# Patient Record
Sex: Female | Born: 1985 | Race: White | Hispanic: No | Marital: Married | Smoking: Current every day smoker
Health system: Southern US, Community
[De-identification: ages and names within clinical notes are randomized; demographics above are authoritative.]

## PROBLEM LIST (undated history)

## (undated) DIAGNOSIS — E162 Hypoglycemia, unspecified: Secondary | ICD-10-CM

## (undated) DIAGNOSIS — F419 Anxiety disorder, unspecified: Secondary | ICD-10-CM

## (undated) DIAGNOSIS — N920 Excessive and frequent menstruation with regular cycle: Secondary | ICD-10-CM

## (undated) DIAGNOSIS — Z6825 Body mass index (BMI) 25.0-25.9, adult: Secondary | ICD-10-CM

## (undated) DIAGNOSIS — R002 Palpitations: Secondary | ICD-10-CM

## (undated) DIAGNOSIS — J392 Other diseases of pharynx: Secondary | ICD-10-CM

## (undated) DIAGNOSIS — D485 Neoplasm of uncertain behavior of skin: Secondary | ICD-10-CM

## (undated) DIAGNOSIS — R079 Chest pain, unspecified: Secondary | ICD-10-CM

## (undated) DIAGNOSIS — J029 Acute pharyngitis, unspecified: Secondary | ICD-10-CM

## (undated) DIAGNOSIS — J069 Acute upper respiratory infection, unspecified: Secondary | ICD-10-CM

## (undated) DIAGNOSIS — R5383 Other fatigue: Secondary | ICD-10-CM

## (undated) DIAGNOSIS — F633 Trichotillomania: Secondary | ICD-10-CM

## (undated) DIAGNOSIS — I700 Atherosclerosis of aorta: Secondary | ICD-10-CM

## (undated) DIAGNOSIS — R200 Anesthesia of skin: Secondary | ICD-10-CM

## (undated) DIAGNOSIS — N6019 Diffuse cystic mastopathy of unspecified breast: Secondary | ICD-10-CM

## (undated) DIAGNOSIS — R519 Headache, unspecified: Secondary | ICD-10-CM

## (undated) DIAGNOSIS — G4730 Sleep apnea, unspecified: Secondary | ICD-10-CM

## (undated) DIAGNOSIS — Z87891 Personal history of nicotine dependence: Secondary | ICD-10-CM

## (undated) HISTORY — DX: Anesthesia of skin: R20.0

## (undated) HISTORY — DX: Headache, unspecified: R51.9

## (undated) HISTORY — DX: Other diseases of pharynx: J39.2

## (undated) HISTORY — DX: Sleep apnea, unspecified: G47.30

## (undated) HISTORY — DX: Acute upper respiratory infection, unspecified: J06.9

## (undated) HISTORY — DX: Other fatigue: R53.83

## (undated) HISTORY — DX: Diffuse cystic mastopathy of unspecified breast: N60.19

## (undated) HISTORY — DX: Acute pharyngitis, unspecified: J02.9

## (undated) HISTORY — DX: Anxiety disorder, unspecified: F41.9

## (undated) HISTORY — DX: Neoplasm of uncertain behavior of skin: D48.5

## (undated) HISTORY — DX: Personal history of nicotine dependence: Z87.891

## (undated) HISTORY — DX: Palpitations: R00.2

## (undated) HISTORY — DX: Atherosclerosis of aorta: I70.0

## (undated) HISTORY — PX: OTHER SURGICAL HISTORY: SHX169

## (undated) HISTORY — DX: Chest pain, unspecified: R07.9

## (undated) HISTORY — DX: Trichotillomania: F63.3

## (undated) HISTORY — PX: WISDOM TOOTH EXTRACTION: SHX21

## (undated) HISTORY — DX: Hypoglycemia, unspecified: E16.2

## (undated) HISTORY — DX: Excessive and frequent menstruation with regular cycle: N92.0

## (undated) HISTORY — DX: Body mass index (BMI) 25.0-25.9, adult: Z68.25

---

## 2017-01-30 DIAGNOSIS — S37812A Contusion of adrenal gland, initial encounter: Secondary | ICD-10-CM

## 2017-01-30 HISTORY — DX: Contusion of adrenal gland, initial encounter: S37.812A

## 2021-10-13 DIAGNOSIS — R079 Chest pain, unspecified: Secondary | ICD-10-CM | POA: Diagnosis not present

## 2021-10-13 DIAGNOSIS — R202 Paresthesia of skin: Secondary | ICD-10-CM | POA: Diagnosis not present

## 2021-10-13 DIAGNOSIS — R200 Anesthesia of skin: Secondary | ICD-10-CM | POA: Diagnosis not present

## 2021-10-13 DIAGNOSIS — R059 Cough, unspecified: Secondary | ICD-10-CM | POA: Diagnosis not present

## 2021-10-13 DIAGNOSIS — R002 Palpitations: Secondary | ICD-10-CM | POA: Diagnosis not present

## 2021-10-13 DIAGNOSIS — E278 Other specified disorders of adrenal gland: Secondary | ICD-10-CM | POA: Diagnosis not present

## 2021-10-13 DIAGNOSIS — Z1331 Encounter for screening for depression: Secondary | ICD-10-CM | POA: Diagnosis not present

## 2021-10-18 ENCOUNTER — Other Ambulatory Visit: Payer: Self-pay

## 2021-10-18 DIAGNOSIS — E162 Hypoglycemia, unspecified: Secondary | ICD-10-CM | POA: Insufficient documentation

## 2021-10-18 DIAGNOSIS — R079 Chest pain, unspecified: Secondary | ICD-10-CM | POA: Insufficient documentation

## 2021-10-18 DIAGNOSIS — G4730 Sleep apnea, unspecified: Secondary | ICD-10-CM | POA: Insufficient documentation

## 2021-10-18 DIAGNOSIS — R5383 Other fatigue: Secondary | ICD-10-CM | POA: Insufficient documentation

## 2021-10-18 DIAGNOSIS — R200 Anesthesia of skin: Secondary | ICD-10-CM | POA: Insufficient documentation

## 2021-10-18 DIAGNOSIS — N6019 Diffuse cystic mastopathy of unspecified breast: Secondary | ICD-10-CM | POA: Insufficient documentation

## 2021-10-18 DIAGNOSIS — R519 Headache, unspecified: Secondary | ICD-10-CM | POA: Insufficient documentation

## 2021-10-18 DIAGNOSIS — I700 Atherosclerosis of aorta: Secondary | ICD-10-CM | POA: Insufficient documentation

## 2021-10-18 DIAGNOSIS — J069 Acute upper respiratory infection, unspecified: Secondary | ICD-10-CM | POA: Insufficient documentation

## 2021-10-18 DIAGNOSIS — J392 Other diseases of pharynx: Secondary | ICD-10-CM | POA: Insufficient documentation

## 2021-10-18 DIAGNOSIS — D485 Neoplasm of uncertain behavior of skin: Secondary | ICD-10-CM | POA: Insufficient documentation

## 2021-10-18 DIAGNOSIS — Z6825 Body mass index (BMI) 25.0-25.9, adult: Secondary | ICD-10-CM | POA: Insufficient documentation

## 2021-10-18 DIAGNOSIS — J029 Acute pharyngitis, unspecified: Secondary | ICD-10-CM | POA: Insufficient documentation

## 2021-10-18 DIAGNOSIS — R002 Palpitations: Secondary | ICD-10-CM | POA: Insufficient documentation

## 2021-10-18 DIAGNOSIS — N920 Excessive and frequent menstruation with regular cycle: Secondary | ICD-10-CM | POA: Insufficient documentation

## 2021-10-18 DIAGNOSIS — Z87891 Personal history of nicotine dependence: Secondary | ICD-10-CM | POA: Insufficient documentation

## 2021-10-18 DIAGNOSIS — F633 Trichotillomania: Secondary | ICD-10-CM | POA: Insufficient documentation

## 2021-10-18 DIAGNOSIS — F419 Anxiety disorder, unspecified: Secondary | ICD-10-CM | POA: Insufficient documentation

## 2021-10-18 DIAGNOSIS — R202 Paresthesia of skin: Secondary | ICD-10-CM | POA: Insufficient documentation

## 2021-10-19 ENCOUNTER — Other Ambulatory Visit: Payer: Self-pay

## 2021-10-19 ENCOUNTER — Ambulatory Visit: Payer: BC Managed Care – PPO | Admitting: Cardiology

## 2021-10-19 ENCOUNTER — Ambulatory Visit (INDEPENDENT_AMBULATORY_CARE_PROVIDER_SITE_OTHER): Payer: BC Managed Care – PPO

## 2021-10-19 ENCOUNTER — Encounter: Payer: Self-pay | Admitting: Cardiology

## 2021-10-19 VITALS — BP 118/60 | HR 72 | Ht 68.6 in | Wt 156.8 lb

## 2021-10-19 DIAGNOSIS — R072 Precordial pain: Secondary | ICD-10-CM | POA: Diagnosis not present

## 2021-10-19 DIAGNOSIS — I700 Atherosclerosis of aorta: Secondary | ICD-10-CM

## 2021-10-19 DIAGNOSIS — F17210 Nicotine dependence, cigarettes, uncomplicated: Secondary | ICD-10-CM

## 2021-10-19 DIAGNOSIS — R002 Palpitations: Secondary | ICD-10-CM

## 2021-10-19 HISTORY — DX: Nicotine dependence, cigarettes, uncomplicated: F17.210

## 2021-10-19 NOTE — Progress Notes (Signed)
Cardiology Office Note:    Date:  10/19/2021   ID:  Brittany Melendez, DOB 02-20-1986, MRN [id number]  PCP:  Brittany Reel, PA  Cardiologist:  Brittany Lindau, MD   Referring MD: Brittany Reel, PA    ASSESSMENT:    1. Palpitations   2. Atherosclerosis of aorta (Micro)   3. Precordial pain   4. Cigarette smoker    PLAN:    In order of problems listed above:  Primary prevention stressed with the patient.  Importance of compliance with diet medication stressed and she vocalized understanding. Cigarette smoker: I spent 5 minutes with the patient discussing solely about smoking. Smoking cessation was counseled. I suggested to the patient also different medications and pharmacological interventions. Patient is keen to try stopping on its own at this time. He will get back to me if he needs any further assistance in this matter. Chest pain: Atypical in nature but in view of risk factors we will do an exercise stress echo and she is agreeable. Mixed dyslipidemia: Significantly elevated lipids especially LDL and diet was emphasized.  We will do a calcium scoring for restratification and address this issue.  She also has aortic atherosclerosis as mentioned below but I do not have clear-cut documentation of this.  Calcium scoring will help target lipid-lowering. Palpitations: Do not seem to be concerning based on the history but to reassure her we will do a 2-week monitor and she is agreeable. Patient will be seen in follow-up appointment in 6 months or earlier if the patient has any concerns    Medication Adjustments/Labs and Tests Ordered: Current medicines are reviewed at length with the patient today.  Concerns regarding medicines are outlined above.  Orders Placed This Encounter  Procedures   CT CARDIAC SCORING   LONG TERM MONITOR (3-14 DAYS)   EKG 12-Lead   ECHOCARDIOGRAM STRESS TEST   No orders of the defined types were placed in this encounter.    History of Present Illness:     Brittany Melendez is a 36 y.o. female who is being seen today for the evaluation of chest pain and palpitations at the request of Brittany Melendez, [place].  Patient is a pleasant 36 year old female.  She has past medical history of aortic atherosclerosis, mixed dyslipidemia and cigarette smoking.  She mentions to me that she will occasionally have chest discomfort.  This does not get worse with exertion.  It does not even occur on exertion consistently.  No chest pain orthopnea or PND otherwise.  She does not exercise on a regular basis.  Sexual activity does not bring around the symptoms.  She feels palpitation like symptoms very occasionally and its only for a couple of seconds.  She is concerned about this.  At the time of my evaluation, the patient is alert awake oriented and in no distress.  Past Medical History:  Diagnosis Date   Anxiety    Atherosclerosis of aorta (HCC)    BMI 25.0-25.9,adult    Chest pain    Fatigue    Fibrocystic breast changes    Headache    Heavy menses    History of tobacco use    Hypoglycemia    Neoplasm of uncertain behavior of skin    Numbness and tingling    Palpitations    Sleep apnea    Throat mass    Trichotillomania    URI, acute    Viral pharyngitis     Past Surgical History:  Procedure Laterality  Date   adnoidectomy     WISDOM TOOTH EXTRACTION      Current Medications: Current Meds  Medication Sig   Multiple Vitamin (MULTI-VITAMIN) tablet Take 1 tablet by mouth daily.     Allergies:   Shellfish allergy, Doxycycline, and Latex   Social History   Socioeconomic History   Marital status: Married    Spouse name: Not on file   Number of children: Not on file   Years of education: Not on file   Highest education level: Not on file  Occupational History   Not on file  Tobacco Use   Smoking status: Every Day    Packs/day: 0.50    Types: Cigarettes   Smokeless tobacco: Not on file  Substance and Sexual Activity   Alcohol use: Never   Drug  use: Not on file   Sexual activity: Not on file  Other Topics Concern   Not on file  Social History Narrative   Not on file   Social Determinants of Health   Financial Resource Strain: Not on file  Food Insecurity: Not on file  Transportation Needs: Not on file  Physical Activity: Not on file  Stress: Not on file  Social Connections: Not on file     Family History: The patient's family history includes Asthma in her mother; GER disease in her father; Lymphoma in her father; Sarcoidosis in her mother.  ROS:   Please see the history of present illness.    All other systems reviewed and are negative.  EKGs/Labs/Other Studies Reviewed:    The following studies were reviewed today: EKG reveals sinus rhythm and nonspecific ST-T changes   Recent Labs: No results found for requested labs within last 8760 hours.  Recent Lipid Panel No results found for: CHOL, TRIG, HDL, CHOLHDL, VLDL, LDLCALC, LDLDIRECT  Physical Exam:    VS:  BP 118/60    Pulse 72    Ht 5' 8.6" (1.742 m)    Wt 156 lb 12.8 oz (71.1 kg)    SpO2 98%    BMI 23.43 kg/m     Wt Readings from Last 3 Encounters:  10/19/21 156 lb 12.8 oz (71.1 kg)     GEN: Patient is in no acute distress HEENT: Normal NECK: No JVD; No carotid bruits LYMPHATICS: No lymphadenopathy CARDIAC: S1 S2 regular, 2/6 systolic murmur at the apex. RESPIRATORY:  Clear to auscultation without rales, wheezing or rhonchi  ABDOMEN: Soft, non-tender, non-distended MUSCULOSKELETAL:  No edema; No deformity  SKIN: Warm and dry NEUROLOGIC:  Alert and oriented x 3 PSYCHIATRIC:  Normal affect    Signed, Brittany Lindau, MD  10/19/2021 2:28 PM    [place]

## 2021-10-19 NOTE — Patient Instructions (Signed)
Medication Instructions:  Your physician recommends that you continue on your current medications as directed. Please refer to the Current Medication list given to you today.  *If you need a refill on your cardiac medications before your next appointment, please call your pharmacy*   Lab Work: None ordered If you have labs (blood work) drawn today and your tests are completely normal, you will receive your results only by: [place] (if you have MyChart) OR A paper copy in the mail If you have any lab test that is abnormal or we need to change your treatment, we will call you to review the results.   Testing/Procedures:  We will order CT coronary calcium score. It will cost $99.00 and is not covered by insurance.  Please call [phone] to schedule.   [facility]  [address], [place] [address]      Stress Echocardiogram Information Sheet                                                      Instructions:    1. You may take your morning medications the morning of the test  2. Light breakfast no caffeine  3. Dress prepared to exercise.  4. DO NOT use ANY caffeine or tobacco products 3 hours before appointment.  5. Please bring all current prescription medications.   WHY IS MY DOCTOR PRESCRIBING ZIO? The Zio system is proven and trusted by physicians to detect and diagnose irregular heart rhythms -- and has been prescribed to hundreds of thousands of patients.  The FDA has cleared the Zio system to monitor for many different kinds of irregular heart rhythms. In a study, physicians were able to reach a diagnosis 90% of the time with the Zio system1.  You can wear the Zio monitor -- a small, discreet, comfortable patch -- during your normal day-to-day activity, including while you sleep, shower, and exercise, while it records every single heartbeat for analysis.  1Barrett, P., et al. Comparison of 24 Hour Holter Monitoring Versus 14 Day Novel  Adhesive Patch Electrocardiographic Monitoring. [place], 2014.  ZIO VS. HOLTER MONITORING The Zio monitor can be comfortably worn for up to 14 days. Holter monitors can be worn for 24 to 48 hours, limiting the time to record any irregular heart rhythms you may have. Zio is able to capture data for the 51% of patients who have their first symptom-triggered arrhythmia after 48 hours.1  LIVE WITHOUT RESTRICTIONS The Zio ambulatory cardiac monitor is a small, unobtrusive, and water-resistant patch--you might even forget youre wearing it. The Zio monitor records and stores every beat of your heart, whether you're sleeping, working out, or showering. Wear the monitor for 14 days, remove 11/02/21.  Follow-Up: At [facility], you and your health needs are our priority.  As part of our continuing mission to provide you with exceptional heart care, we have created designated Provider Care Teams.  These Care Teams include your primary Cardiologist (physician) and Advanced Practice Providers (APPs -  Physician Assistants and Nurse Practitioners) who all work together to provide you with the care you need, when you need it.  We recommend signing up for the patient portal called "MyChart".  Sign up information is provided on this After Visit Summary.  MyChart is used to connect with  patients for Virtual Visits (Telemedicine).  Patients are able to view lab/test results, encounter notes, upcoming appointments, etc.  Non-urgent messages can be sent to your provider as well.   To learn more about what you can do with MyChart, go to [email or web address].    Your next appointment:   3 month(s)  The format for your next appointment:   In Person  Provider:   Jyl Heinz, MD   Other Instructions  Coronary Calcium Scan A coronary calcium scan is an imaging test used to look for deposits of plaque in the inner lining of the blood vessels of the heart (coronary arteries). Plaque is  made up of calcium, protein, and fatty substances. These deposits of plaque can partly clog and narrow the coronary arteries without producing any symptoms or warning signs. This puts a person at risk for a heart attack. This test is recommended for people who are at moderate risk for heart disease. The test can find plaque deposits before symptoms develop. Tell a health care provider about: Any allergies you have. All medicines you are taking, including vitamins, herbs, eye drops, creams, and over-the-counter medicines. Any problems you or family members have had with anesthetic medicines. Any blood disorders you have. Any surgeries you have had. Any medical conditions you have. Whether you are pregnant or may be pregnant. What are the risks? Generally, this is a safe procedure. However, problems may occur, including: Harm to a pregnant woman and her unborn baby. This test involves the use of radiation. Radiation exposure can be dangerous to a pregnant woman and her unborn baby. If you are pregnant or think you may be pregnant, you should not have this procedure done. Slight increase in the risk of cancer. This is because of the radiation involved in the test. What happens before the procedure? Ask your health care provider for any specific instructions on how to prepare for this procedure. You may be asked to avoid products that contain caffeine, tobacco, or nicotine for 4 hours before the procedure. What happens during the procedure? You will undress and remove any jewelry from your neck or chest. You will put on a hospital gown. Sticky electrodes will be placed on your chest. The electrodes will be connected to an electrocardiogram (ECG) machine to record a tracing of the electrical activity of your heart. You will lie down on a curved bed that is attached to the [place]. You may be given medicine to slow down your heart rate so that clear pictures can be created. You will be moved into  the CT scanner, and the CT scanner will take pictures of your heart. During this time, you will be asked to lie still and hold your breath for 2-3 seconds at a time while each picture of your heart is being taken. The procedure may vary among health care providers and hospitals.    What happens after the procedure? You can get dressed. You can return to your normal activities. It is up to you to get the results of your procedure. Ask your health care provider, or the department that is doing the procedure, when your results will be ready. Summary A coronary calcium scan is an imaging test used to look for deposits of plaque in the inner lining of the blood vessels of the heart (coronary arteries). Plaque is made up of calcium, protein, and fatty substances. Generally, this is a safe procedure. Tell your health care provider if you are pregnant or may be pregnant. Ask  your health care provider for any specific instructions on how to prepare for this procedure. A CT scanner will take pictures of your heart. You can return to your normal activities after the scan is done. This information is not intended to replace advice given to you by your health care provider. Make sure you discuss any questions you have with your health care provider. Document Revised: 04/09/2019 Document Reviewed: 04/09/2019 Elsevier Patient Education  [place].   Exercise Stress Echocardiogram An exercise stress echocardiogram is a test to check how well your heart is working. This test uses sound waves and a computer to make pictures of your heart. These pictures will be taken before and after you exercise. For this test, you will walk on a treadmill or ride a bicycle to make your heart beat faster. While you exercise, your heart will be checked with an electrocardiogram (ECG). Your blood pressure will also be checked. You may have this test if: You have chest pain or a heart problem. You had a heart attack or  heart surgery not long ago. You have heart valve problems. You have a condition that causes narrowing of the blood vessels that supply your heart. You have a high risk of heart disease and: You are starting a new exercise program. You need to have a big surgery. Tell a doctor about: Any allergies you have. All medicines you are taking. This includes vitamins, herbs, eye drops, creams, and over-the-counter medicines. Any problems you or family members have had with medicines that make you fall asleep (anesthetic medicines). Any surgeries you have had. Any blood disorders you have. Any medical conditions you have. Whether you are pregnant or may be pregnant. What are the risks? Generally, this is a safe test. However, problems may occur, including: Chest pain. Feeling dizzy or light-headed. Shortness of breath. Increased or irregular heartbeat. Feeling like you may vomit (nausea) or vomiting. Heart attack. This is very rare. What happens before the test? Medicines Ask your doctor about changing or stopping your normal medicines. This is important if you take diabetes medicines or blood thinners. If you use an inhaler, bring it to the test. General instructions Wear comfortable clothes and walking shoes. Follow instructions from your doctor about what you cannot eat or drink before the test. Do not drink or eat anything that has caffeine in it. Stop having caffeine 24 hours before the test. Do not smoke or use products that contain nicotine or tobacco for 4 hours before the test. If you need help quitting, ask your doctor. What happens during the test?  You will take off your clothes from the waist up and put on a hospital gown. Electrodes or patches will be put on your chest. A blood pressure cuff will be put on your arm. Before you exercise, a computer will make a picture of your heart. To do this: You will lie down and a gel will be put on your chest. A wand will be moved over  the gel. Sound waves from the wand will go to the computer to make the picture. Then, you will start to exercise. You may walk on a treadmill or pedal a bicycle. Your blood pressure and heart rhythm will be checked while you exercise. The exercise will get harder or faster. You will exercise until: Your heart reaches a certain level. You are too tired to go on. You cannot go on because of chest pain, weakness, or dizziness. You will lie down right away so  another picture of your heart can be taken. The procedure may vary among doctors and hospitals. What can I expect after the test? After your test, it is common to have: Mild soreness. Mild tiredness. Your heart rate and blood pressure will be checked until they return to your normal levels. You should not have any new symptoms after this test. Follow these instructions at home: If your doctor says that you can, you may: Eat what you normally eat. Do your normal activities. Take over-the-counter and prescription medicines only as told by your doctor. Keep all follow-up visits. It is up to you to get the results of your test. Ask how to get your results when they are ready. Contact a doctor if: You feel dizzy or light-headed. You have a fast or irregular heartbeat. You feel like you may vomit or you vomit. You have a headache. You feel short of breath. Get help right away if: You develop pain or pressure: In your chest. In your jaw or neck. Between your shoulders. That goes down your left arm. You faint. You have trouble breathing. These symptoms may be an emergency. Get medical help right away. Call your local emergency services (911 in the [place] Do not wait to see if the symptoms will go away. Do not drive yourself to the hospital. Summary This is a test that checks how well your heart is working. Follow instructions about what you cannot eat or drink before the test. Ask your doctor if you should take your normal  medicines before the test. Stop having caffeine 24 hours before the test. Do not smoke or use products with nicotine or tobacco in them for 4 hours before the test. During the test, your blood pressure and heart rhythm will be checked while you exercise. This information is not intended to replace advice given to you by your health care provider. Make sure you discuss any questions you have with your health care provider. Document Revised: 06/02/2021 Document Reviewed: 05/12/2020 Elsevier Patient Education  2022 [employer].

## 2021-11-01 ENCOUNTER — Encounter (HOSPITAL_COMMUNITY): Payer: Self-pay | Admitting: *Deleted

## 2021-11-05 DIAGNOSIS — J342 Deviated nasal septum: Secondary | ICD-10-CM | POA: Diagnosis not present

## 2021-11-05 DIAGNOSIS — J358 Other chronic diseases of tonsils and adenoids: Secondary | ICD-10-CM | POA: Diagnosis not present

## 2021-11-05 DIAGNOSIS — J392 Other diseases of pharynx: Secondary | ICD-10-CM | POA: Diagnosis not present

## 2021-11-08 ENCOUNTER — Ambulatory Visit (HOSPITAL_COMMUNITY): Payer: BC Managed Care – PPO

## 2021-11-08 ENCOUNTER — Other Ambulatory Visit: Payer: Self-pay

## 2021-11-08 ENCOUNTER — Ambulatory Visit (HOSPITAL_COMMUNITY): Payer: BC Managed Care – PPO | Attending: Cardiology

## 2021-11-08 DIAGNOSIS — R072 Precordial pain: Secondary | ICD-10-CM | POA: Insufficient documentation

## 2021-11-08 DIAGNOSIS — I700 Atherosclerosis of aorta: Secondary | ICD-10-CM | POA: Diagnosis not present

## 2021-11-08 MED ORDER — PERFLUTREN LIPID MICROSPHERE
2.0000 mL | INTRAVENOUS | Status: AC | PRN
  Administered 2021-11-08: 2 mL via INTRAVENOUS

## 2022-01-06 ENCOUNTER — Ambulatory Visit (INDEPENDENT_AMBULATORY_CARE_PROVIDER_SITE_OTHER)
Admission: RE | Admit: 2022-01-06 | Discharge: 2022-01-06 | Disposition: A | Discharge status: Home / Self Care | Payer: Self-pay | Source: Ambulatory Visit | Attending: Cardiology | Admitting: Cardiology

## 2022-01-06 ENCOUNTER — Encounter: Payer: Self-pay | Admitting: Radiology

## 2022-01-06 DIAGNOSIS — I700 Atherosclerosis of aorta: Secondary | ICD-10-CM

## 2022-01-18 ENCOUNTER — Ambulatory Visit: Payer: BC Managed Care – PPO | Admitting: Cardiology

## 2023-01-20 IMAGING — CT CT CARDIAC CORONARY ARTERY CALCIUM SCORE
3 series · 14 of 20 positions shown, 16 images · non-contrast
Comparison: CT of the chest on 01/29/2017 at [facility]
COMPARISON: CT of the chest on 01/29/2017 at [facility]

Addendum:
EXAM:
OVER-READ INTERPRETATION  CT CHEST

The following report is an over-read performed by radiologist Dr.
over-read does not include interpretation of cardiac or coronary
anatomy or pathology. The coronary calcium score interpretation by
the cardiologist is attached.
CLINICAL DATA: Cardiovascular Disease Risk stratification
Coronary Calcium Score
TECHNIQUE: A gated, non-contrast computed tomography scan of the heart was
performed using 3mm slice thickness. Axial images were analyzed on a
dedicated workstation. Calcium scoring of the coronary arteries was
performed using the Agatston method.

[Series 2: cascseq 2.0 sa36 70% (id) · axial · 0.39mm/px · z∈[-263,-173]mm · 4 of 76 slices shown]
[im 16/76  vessel]
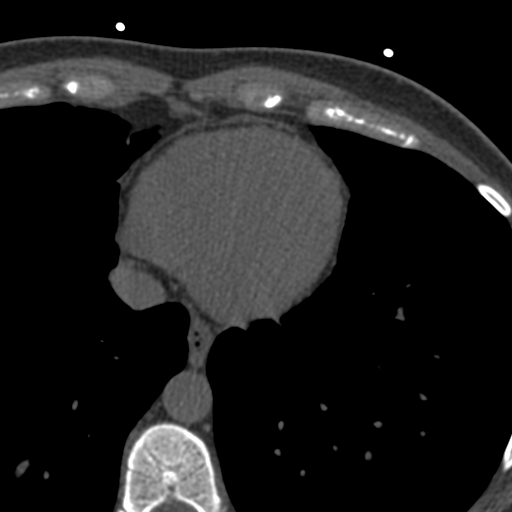
[im 31/76  vessel]
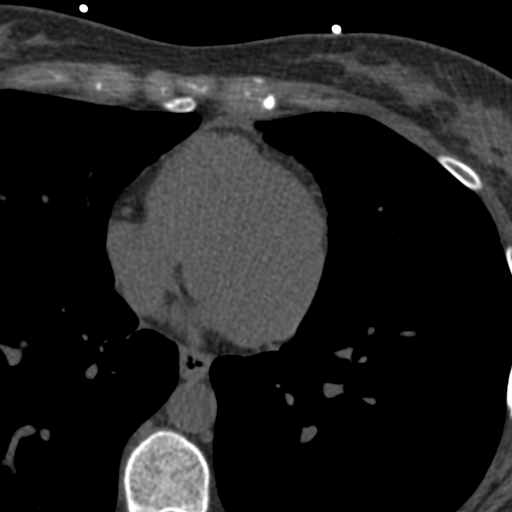
[im 46/76  vessel]
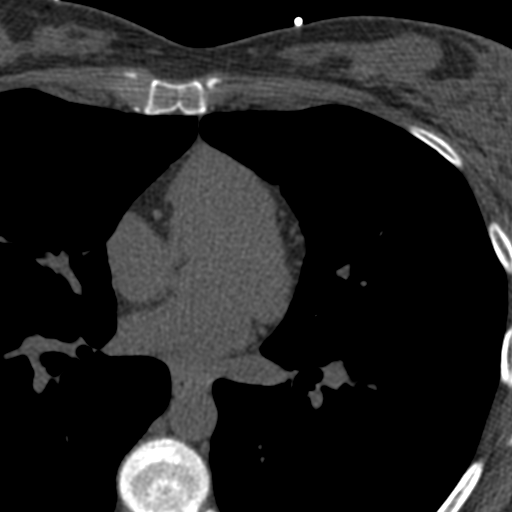
[im 61/76  vessel]
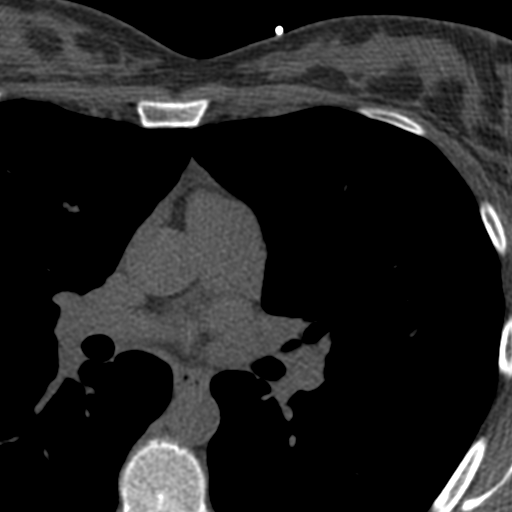

[Series 3: cascseq 2.0 bf37 st · axial · 0.70mm/px · z∈[-269,-169]mm · 5 of 76 slices shown, 7 images]
[im 13/76  vessel]
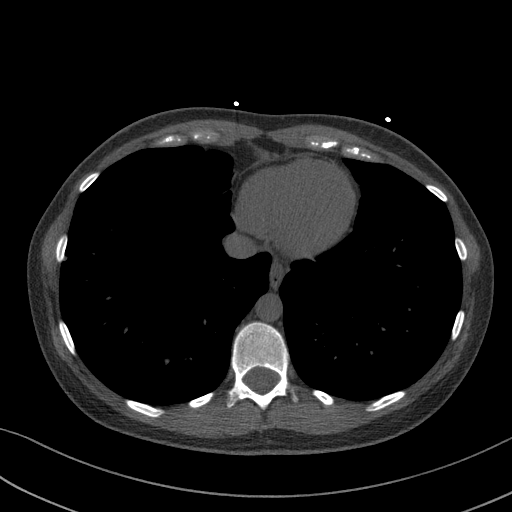
[im 13/76  lung]
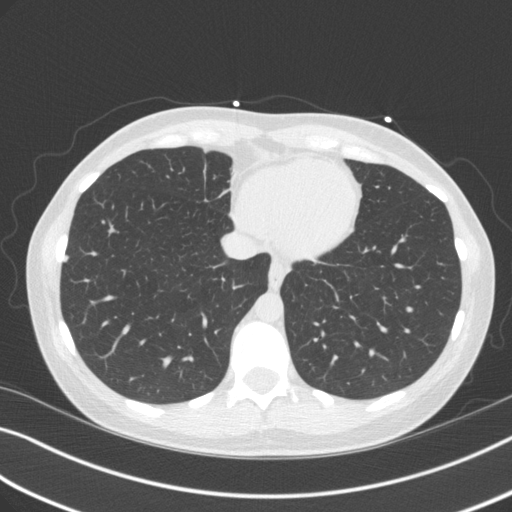
[im 26/76  vessel]
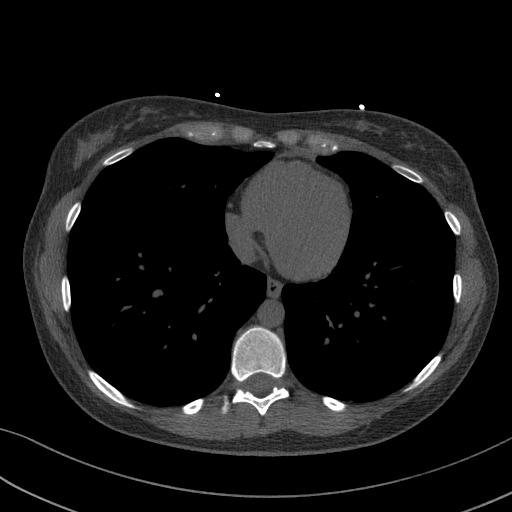
[im 38/76  vessel]
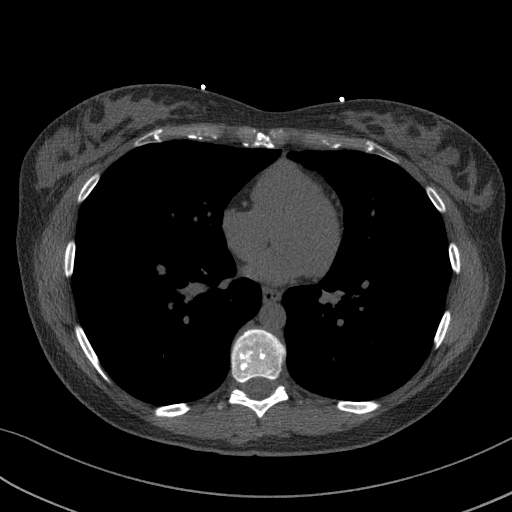
[im 51/76  vessel]
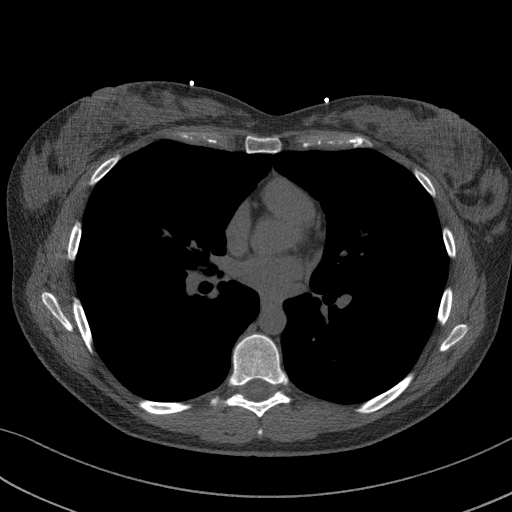
[im 63/76  vessel]
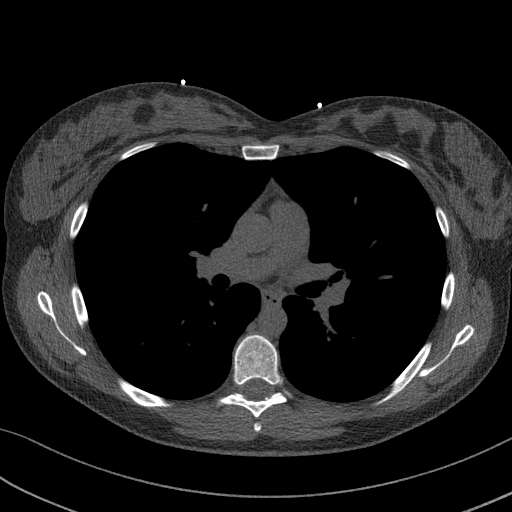
[im 63/76  lung]
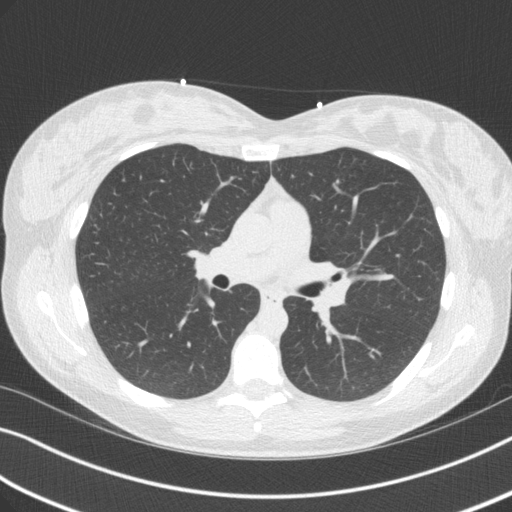

[Series 4: cascseq 2.0 br59 lung · axial · 0.70mm/px · z∈[-269,-169]mm · 5 of 76 slices shown]
[im 13/76  lung]
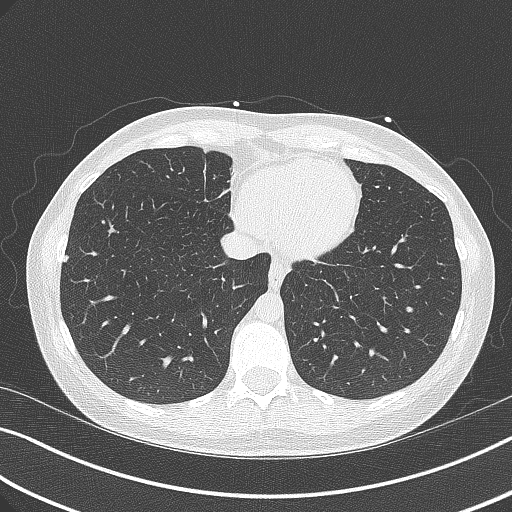
[im 26/76  lung]
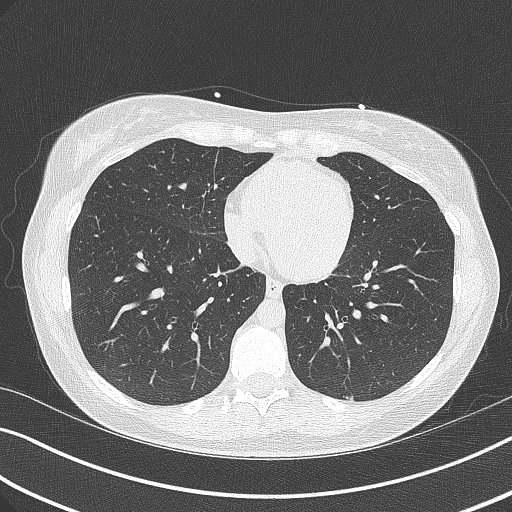
[im 38/76  lung]
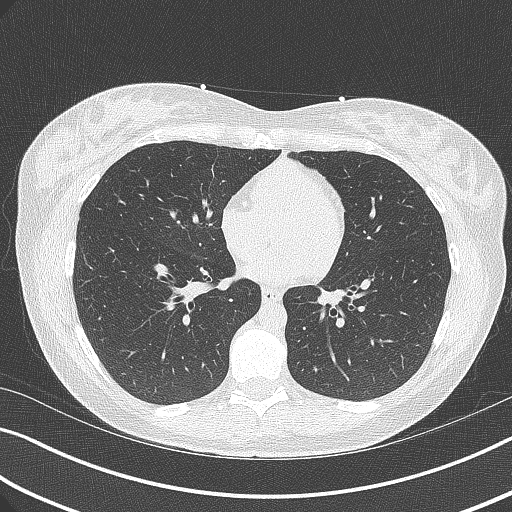
[im 51/76  lung]
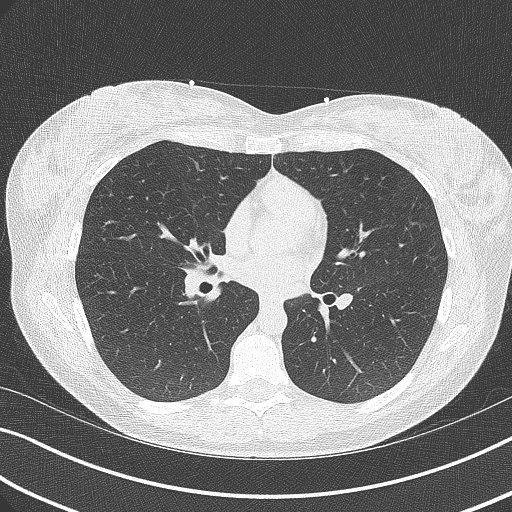
[im 63/76  lung]
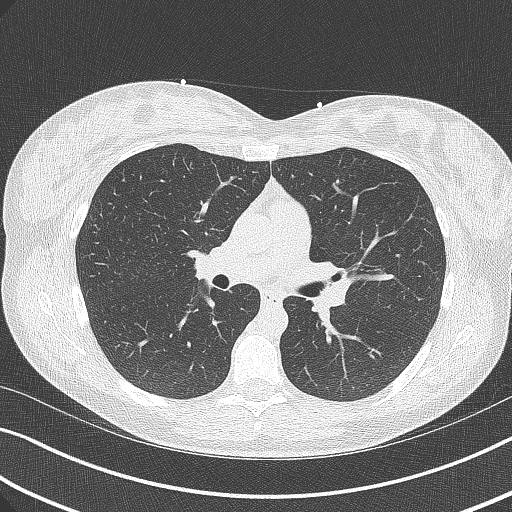

[14 of 20 positions shown; findings below may reference images not displayed]

FINDINGS: Vascular: No significant noncardiac vascular findings.

Mediastinum/Nodes: Visualized mediastinum and hilar regions
demonstrate no lymphadenopathy or masses.

Lungs/Pleura: 5 mm subpleural nodule in the lateral right lower lobe
on image 64 is stable since 0705 and therefore benign. Visualized
lungs show no evidence of pulmonary edema, consolidation,
pneumothorax or pleural fluid.

Upper Abdomen: No acute abnormality.

Musculoskeletal: No chest wall mass or suspicious bone lesions
identified.
IMPRESSION: 5 mm right lower lobe subpleural nodule is stable since 0705 and
therefore benign.
FINDINGS: Coronary arteries: Normal origins.

Coronary Calcium Score:

Left main:

Left anterior descending artery:

Left circumflex artery:

Right coronary artery:

Total: 0

Percentile:

Pericardium: Normal.

Ascending Aorta: Normal caliber.

Non-cardiac: See separate report from [REDACTED].
IMPRESSION: Coronary calcium score of 0.



If CAC=0, it is reasonable to withhold statin therapy and reassess
in 5 to 10 years, as long as higher risk conditions are absent
(diabetes mellitus, family history of premature CHD in first degree
relatives (males <55 years; females <65 years), cigarette smoking,
or LDL >=190 mg/dL).

If CAC is 1 to 99, it is reasonable to initiate statin therapy for
patients >=55 years of age.

If CAC is >=100 or >=75th percentile, it is reasonable to initiate
statin therapy at any age.

Cardiology referral should be considered for patients with CAC
scores >=400 or >=75th percentile.

*0705 AHA/ACC/AACVPR/AAPA/ABC/DEVETAK/DACRES/LIENAD/[place]/CANCHOLA/SOOD/ANH
Guideline on the Management of Blood Cholesterol: A Report of the
American College of Cardiology/American Heart Association Task Force
on Clinical Practice Guidelines. J Am Coll Cardiol.
2484;73(24):0749-0218.

*** End of Addendum ***
EXAM:
OVER-READ INTERPRETATION  CT CHEST

The following report is an over-read performed by radiologist Dr.
over-read does not include interpretation of cardiac or coronary
anatomy or pathology. The coronary calcium score interpretation by
the cardiologist is attached.
FINDINGS: Vascular: No significant noncardiac vascular findings.

Mediastinum/Nodes: Visualized mediastinum and hilar regions
demonstrate no lymphadenopathy or masses.

Lungs/Pleura: 5 mm subpleural nodule in the lateral right lower lobe
on image 64 is stable since 0705 and therefore benign. Visualized
lungs show no evidence of pulmonary edema, consolidation,
pneumothorax or pleural fluid.

Upper Abdomen: No acute abnormality.

Musculoskeletal: No chest wall mass or suspicious bone lesions
identified.
IMPRESSION: 5 mm right lower lobe subpleural nodule is stable since 0705 and
therefore benign.
